# Patient Record
Sex: Female | Born: 1935 | Race: White | Hispanic: No | Smoking: Never smoker
Health system: Southern US, Community
[De-identification: ages and names within clinical notes are randomized; demographics above are authoritative.]

## PROBLEM LIST (undated history)

## (undated) DIAGNOSIS — N390 Urinary tract infection, site not specified: Secondary | ICD-10-CM

## (undated) DIAGNOSIS — K812 Acute cholecystitis with chronic cholecystitis: Secondary | ICD-10-CM

## (undated) DIAGNOSIS — I10 Essential (primary) hypertension: Secondary | ICD-10-CM

## (undated) DIAGNOSIS — G459 Transient cerebral ischemic attack, unspecified: Secondary | ICD-10-CM

## (undated) DIAGNOSIS — F0390 Unspecified dementia without behavioral disturbance: Secondary | ICD-10-CM

## (undated) HISTORY — PX: BLADDER SURGERY: SHX569

---

## 2004-05-06 ENCOUNTER — Ambulatory Visit: Payer: Self-pay | Admitting: Internal Medicine

## 2004-05-24 ENCOUNTER — Ambulatory Visit: Payer: Self-pay | Admitting: Internal Medicine

## 2004-12-13 ENCOUNTER — Ambulatory Visit: Payer: Self-pay | Admitting: Internal Medicine

## 2005-05-19 ENCOUNTER — Ambulatory Visit: Payer: Self-pay | Admitting: Internal Medicine

## 2006-06-22 ENCOUNTER — Ambulatory Visit: Payer: Self-pay | Admitting: Internal Medicine

## 2007-08-09 ENCOUNTER — Ambulatory Visit: Payer: Self-pay | Admitting: Internal Medicine

## 2008-08-15 ENCOUNTER — Ambulatory Visit: Payer: Self-pay | Admitting: Internal Medicine

## 2009-08-17 ENCOUNTER — Ambulatory Visit: Payer: Self-pay | Admitting: Internal Medicine

## 2010-08-20 ENCOUNTER — Ambulatory Visit: Payer: Self-pay | Admitting: Internal Medicine

## 2010-12-27 ENCOUNTER — Ambulatory Visit: Payer: Self-pay | Admitting: Ophthalmology

## 2011-01-05 ENCOUNTER — Ambulatory Visit: Payer: Self-pay | Admitting: Ophthalmology

## 2011-08-31 ENCOUNTER — Ambulatory Visit: Payer: Self-pay | Admitting: Ophthalmology

## 2011-09-20 ENCOUNTER — Ambulatory Visit: Payer: Self-pay | Admitting: Internal Medicine

## 2012-09-20 ENCOUNTER — Ambulatory Visit: Payer: Self-pay | Admitting: Internal Medicine

## 2013-04-17 ENCOUNTER — Ambulatory Visit: Payer: Self-pay | Admitting: Obstetrics and Gynecology

## 2013-04-17 LAB — BASIC METABOLIC PANEL
Anion Gap: 4 — ABNORMAL LOW (ref 7–16)
BUN: 20 mg/dL — AB (ref 7–18)
CO2: 31 mmol/L (ref 21–32)
Calcium, Total: 8.9 mg/dL (ref 8.5–10.1)
Chloride: 105 mmol/L (ref 98–107)
Creatinine: 1.11 mg/dL (ref 0.60–1.30)
EGFR (Non-African Amer.): 46 — ABNORMAL LOW
GFR CALC AF AMER: 53 — AB
GLUCOSE: 96 mg/dL (ref 65–99)
OSMOLALITY: 282 (ref 275–301)
POTASSIUM: 4 mmol/L (ref 3.5–5.1)
SODIUM: 140 mmol/L (ref 136–145)

## 2013-04-17 LAB — CBC
HCT: 39.1 % (ref 35.0–47.0)
HGB: 13.1 g/dL (ref 12.0–16.0)
MCH: 32.1 pg (ref 26.0–34.0)
MCHC: 33.5 g/dL (ref 32.0–36.0)
MCV: 96 fL (ref 80–100)
Platelet: 199 10*3/uL (ref 150–440)
RBC: 4.08 10*6/uL (ref 3.80–5.20)
RDW: 13.7 % (ref 11.5–14.5)
WBC: 6.9 10*3/uL (ref 3.6–11.0)

## 2013-04-23 ENCOUNTER — Ambulatory Visit: Payer: Self-pay | Admitting: Obstetrics and Gynecology

## 2013-04-24 LAB — BASIC METABOLIC PANEL
ANION GAP: 3 — AB (ref 7–16)
BUN: 17 mg/dL (ref 7–18)
CALCIUM: 8.1 mg/dL — AB (ref 8.5–10.1)
Chloride: 106 mmol/L (ref 98–107)
Co2: 30 mmol/L (ref 21–32)
Creatinine: 1.17 mg/dL (ref 0.60–1.30)
EGFR (Non-African Amer.): 43 — ABNORMAL LOW
GFR CALC AF AMER: 50 — AB
GLUCOSE: 136 mg/dL — AB (ref 65–99)
Osmolality: 281 (ref 275–301)
Potassium: 4.3 mmol/L (ref 3.5–5.1)
Sodium: 139 mmol/L (ref 136–145)

## 2013-04-24 LAB — HEMOGLOBIN: HGB: 11 g/dL — ABNORMAL LOW (ref 12.0–16.0)

## 2013-10-09 ENCOUNTER — Ambulatory Visit: Payer: Self-pay | Admitting: Internal Medicine

## 2014-01-22 ENCOUNTER — Ambulatory Visit: Payer: Self-pay | Admitting: Internal Medicine

## 2014-05-17 NOTE — Op Note (Signed)
PATIENT NAME:  Beth Ramirez, Beth Ramirez MR#:  [id number] DATE OF BIRTH:  [birth year]  DATE OF OPERATION:  04/23/2013  PRINCIPAL DIAGNOSIS: Stress urinary incontinence.   POSTOPERATIVE DIAGNOSIS: Stress urinary incontinence.   PROCEDURE: Pubovaginal sling.   SURGEON: Dr. Edrick Oh   ASSISTANT:  Dr. Hassell Done DeFrancesco  ANESTHESIA: Spinal.   INDICATION: The patient is an 81 year old white female with a history of prolapse. She has not had any recent stress component incontinence. It was felt that the degree of prolapse was a factor in the resolution of previous urinary incontinence. The decision was made to evaluate under anesthesia for possible pubovaginal sling.   DESCRIPTION OF PROCEDURE: After informed consent was obtained, the patient was taken to the Operating Room and placed in the dorsal lithotomy position under spinal anesthesia with adequate levels. The patient was then prepped and draped in the usual standard fashion. A 16 French Foley catheter was placed to gravity drainage without difficulty. The anterior vaginal wall dissection was undertaken by Dr. Enzo Bi, with my assistance. This will be dictated under a separate operative note. Once the dissection was completed, the bladder had been evaluated prior to catheter placement, as the cystocele was reduced, the patient had coughed while under spinal anesthesia, with a strong stream of urine noted coming from the ureteral orifice. This is consistent with stress component urinary incontinence. With the cystocele reduced, the decision was made, based on this information, to proceed with pubovaginal sling. The outer edges of the symphysis pubis were marked utilizing a marking pen. Small skin incisions were made utilizing the knife. A Stamey needle was utilized to advance posterior to the symphysis to the level of the incision. This was performed under tactile guidance. The standard sling kit was not available for the procedure. With the needle in  position, a segment of tape from a different kit was utilized. It was secured to the Stamey needle utilizing a 3-0 nylon suture. The sling material was then brought through the incision site on the left. The needle was then placed on the right in a similar fashion to the vaginal incision site. The other arm of the sling material was secured to the needle also utilizing a 3-0 nylon suture. The sling material was brought through the abdominal incision site. Reorientation was undertaken to correct a twist in the sling material. With it in proper orientation and position, it was placed over the mid urethra. It was secured at the 6 o'clock and 12 o'clock positions utilizing a 4-0 Vicryl suture. The remainder of the procedure was then completed. Cystoscopy was performed utilizing the 22 French rigid cystoscope. This demonstrated no significant urethral tethering. No significant bladder abnormalities were appreciated. The ureteral orifices were not initially identified. There was a moderate heaping of tissue in the central aspect of the bladder due to the repair. Indigo carmine was given. This was not visualized after approximately 10 minutes. Several attempts were made at utilizing a guidewire to identify the ureteral orifices. With continued looking, the orifices were eventually found more lateral than had been expected. The left ureteral orifice was easily intubated with the guidewire. It was easily advanced into the upper pole collecting system without difficulty. Efflux of urine was noted from the ureteral orifice.   The right ureteral orifice was then identified. Multiple attempts were made at passing the guidewire, which was unsuccessful. However, good efflux was noted from the ureteral orifice on several occasions. The bladder demonstrated no evidence of sling material passage or other injury. The bladder  was filled with approximately 500 mL of sterile saline. Some leakage was noted upon withdrawal of the scope.  Gentle traction was placed on the sling material, with resolution of the leakage. The scope was easily advanced through the urethra. Gentle traction on the scope was undertaken. Some leakage was encountered when the scope was removed. A very gentle traction on the sling material resulted in complete resolution of leakage once again. The arms of the sling were then cut at the skin level. The remainder of the vaginal closure was undertaken by Dr. Enzo Bi, with my assistance. The 1 French Foley catheter was replaced to gravity drainage. At the end of the procedure, a vaginal packing with Premarin was placed. The patient tolerated the procedure well. There were no problems or complications. Estimated blood loss from the pubovaginal sling was minimal.    ____________________________ Denice Bors. Jacqlyn Larsen, MD bsc:mr D: 04/23/2013 15:35:56 ET T: 04/23/2013 20:01:28 ET JOB#: [id number]  cc: Denice Bors. Jacqlyn Larsen, MD, <Dictator> Denice Bors. Jacqlyn Larsen, MD  Denice Bors Eljay Lave MD ELECTRONICALLY SIGNED 04/26/2013 8:14

## 2014-05-17 NOTE — Op Note (Signed)
PATIENT NAME:  Beth Ramirez, Beth W MR#:  [id number] DATE OF BIRTH:  03-03-1926  DATE OF PROCEDURE:  04/23/2013  PREOPERATIVE DIAGNOSES:  1. Pelvic organ prolapse.  2. Urinary incontinence.   POSTOPERATIVE DIAGNOSES:  1. Third-degree cystourethrocele.  2. Rectocele.  3. Enterocele.  4. Urinary incontinence.   OPERATIVE PROCEDURES:  1. Anterior/posterior colporrhaphies with enterocele ligation.  2. Pubovaginal sling with cystoscopy.   SURGEONS: Prentice DockerMartin A. DeFrancesco, MD, and Madolyn FriezeBrian S. Achilles Dunkope, MD.  FIRST ASSISTANTS: Dr. Cope/Dr. DeFrancesco.   ANESTHESIA: Spinal.   INDICATIONS: The patient is an 81 year old married white female status post hysterectomy, with a long history of pelvic organ prolapse that has been suboptimally managed with pessary. Due to recurrent vaginal ulcerations, the patient and family opted to proceed with definitive surgical repair.   FINDINGS AT SURGERY: Revealed a completely prolapsed vagina with third-degree cystourethrocele present. There also was an enterocele and high rectocele. On cystoscopy, ureteral orifices demonstrated urine efflux.   DESCRIPTION OF THE PROCEDURE: The patient was brought to the operating room, where she was placed in the sitting position. Spinal anesthetic was introduced without difficulty. She was placed in the dorsal lithotomy position using the candy-cane stirrups. A Betadine perineal and intravaginal prep and drape was performed in standard fashion. A Foley catheter was placed into the bladder, and it was draining clear yellow urine. A weighted speculum was placed into the vagina, and Allis clamps were used to grasp the apex of the vaginal vault. A transverse incision was made in the vaginal mucosa. This was undermined in the midline using Metzenbaum scissors. The vaginal mucosa was incised in the midline, and Allis-Adair retractors were used to facilitate exposure. This process was carried out up to within 1 cm of the urethral meatus.  Following dissection of vaginal mucosa off the perivesical fascia, the fascia was dissected from the vagina through both sharp and blunt dissection. The enterocele was isolated posteriorly as was the high rectocele. The high rectocele and enterocele were closed using horizontal mattress sutures of 0 Vicryl. This reduced the defect nicely. Next, the pubovaginal sling was placed. Please see Dr. Wynn Maudlinope's note for the pubovaginal sling and cystoscopy details. Following placement of the sling, the anterior colporrhaphy was performed using 2-0 Vicryl suture in a horizontal mattress technique. This reduced the bladder and gave it more natural support. The residual vaginal mucosa was trimmed, and once the pubovaginal sling was completed with cystoscopy, the vaginal mucosa was closed using simple interrupted sutures of 2-0 chromic. Vaginal packing was placed using Premarin cream as coating. The patient was then mobilized and taken to the recovery room in satisfactory condition.   ESTIMATED BLOOD LOSS: 50 mL.  IV FLUIDS: 800 mL.   URINE OUTPUT: Could not be quantified due to cystoscopy and bladder filling.   COUNTS: All instruments, needle and sponge counts were verified as correct.    ____________________________ Prentice DockerMartin A. DeFrancesco, MD mad:lb D: 04/24/2013 08:13:57 ET T: 04/24/2013 08:20:41 ET JOB#: [id number]  cc: Daphine DeutscherMartin A. DeFrancesco, MD, <Dictator> [facility] Madolyn FriezeBrian S. Achilles Dunkope, MD Prentice DockerMARTIN A DEFRANCESCO MD ELECTRONICALLY SIGNED 05/03/2013 4:42

## 2014-12-17 ENCOUNTER — Encounter: Payer: Self-pay | Admitting: Emergency Medicine

## 2014-12-17 ENCOUNTER — Emergency Department
Admission: EM | Admit: 2014-12-17 | Discharge: 2014-12-17 | Disposition: A | Discharge status: Home / Self Care | Payer: Medicare Other | Attending: Emergency Medicine | Admitting: Emergency Medicine

## 2014-12-17 DIAGNOSIS — Z7982 Long term (current) use of aspirin: Secondary | ICD-10-CM | POA: Diagnosis not present

## 2014-12-17 DIAGNOSIS — I10 Essential (primary) hypertension: Secondary | ICD-10-CM | POA: Diagnosis not present

## 2014-12-17 DIAGNOSIS — Z79899 Other long term (current) drug therapy: Secondary | ICD-10-CM | POA: Insufficient documentation

## 2014-12-17 DIAGNOSIS — N390 Urinary tract infection, site not specified: Secondary | ICD-10-CM

## 2014-12-17 DIAGNOSIS — R300 Dysuria: Secondary | ICD-10-CM | POA: Diagnosis present

## 2014-12-17 DIAGNOSIS — Z792 Long term (current) use of antibiotics: Secondary | ICD-10-CM | POA: Diagnosis not present

## 2014-12-17 HISTORY — DX: Urinary tract infection, site not specified: N39.0

## 2014-12-17 HISTORY — DX: Unspecified dementia, unspecified severity, without behavioral disturbance, psychotic disturbance, mood disturbance, and anxiety: F03.90

## 2014-12-17 HISTORY — DX: Essential (primary) hypertension: I10

## 2014-12-17 MED ORDER — CEFTRIAXONE SODIUM 1 G IJ SOLR
1.0000 g | Freq: Once | INTRAMUSCULAR | Status: AC
  Administered 2014-12-17: 1 g via INTRAMUSCULAR
  Filled 2014-12-17: qty 10

## 2014-12-17 MED ORDER — LIDOCAINE HCL (PF) 1 % IJ SOLN
INTRAMUSCULAR | Status: AC
  Administered 2014-12-17: 5 mL
  Filled 2014-12-17: qty 5

## 2014-12-17 NOTE — Discharge Instructions (Signed)
You were given one shot of Rocephin here in the emergency department which will cover you for 24 hours. A prescription for Vantin (cefpodoxime) was called into [facility] on [address]  Return to the emergency department for any worsening condition including abdominal pain, inability to urinate, fever, or altered mental status.   Urinary Tract Infection A urinary tract infection (UTI) can occur any place along the urinary tract. The tract includes the kidneys, ureters, bladder, and urethra. A type of germ called bacteria often causes a UTI. UTIs are often helped with antibiotic medicine.  HOME CARE   If given, take antibiotics as told by your doctor. Finish them even if you start to feel better.  Drink enough fluids to keep your pee (urine) clear or pale yellow.  Avoid tea, drinks with caffeine, and bubbly (carbonated) drinks.  Pee often. Avoid holding your pee in for a long time.  Pee before and after having sex (intercourse).  Wipe from front to back after you poop (bowel movement) if you are a woman. Use each tissue only once. GET HELP RIGHT AWAY IF:   You have back pain.  You have lower belly (abdominal) pain.  You have chills.  You feel sick to your stomach (nauseous).  You throw up (vomit).  Your burning or discomfort with peeing does not go away.  You have a fever.  Your symptoms are not better in 3 days. MAKE SURE YOU:   Understand these instructions.  Will watch your condition.  Will get help right away if you are not doing well or get worse.   This information is not intended to replace advice given to you by your health care provider. Make sure you discuss any questions you have with your health care provider.   Document Released: 06/29/2007 Document Revised: 01/31/2014 Document Reviewed: 08/11/2011 Elsevier Interactive Patient Education [employer]2016 Elsevier Inc.

## 2014-12-17 NOTE — ED Provider Notes (Signed)
[facility] Emergency Department Provider Note   ____________________________________________  Time seen:  I have reviewed the triage vital signs and the triage nursing note.  HISTORY  Chief Complaint Urinary Tract Infection   Historian Patient limited historian due to dementia Daughter provides history  HPI Beth Ramirez is a 81 y.o. female who is here "for IV antibiotics" because she had a urine culture results that returned to Dr.Hande which showed multiple resistance patterns, and given patient's allergy history, there was concern that there was no by mouth antibiotic choice.  Patient has had urinary frequency and burning for several days which prompted an outpatient urine culture which did grow urinary tract infection. She takes Bactrim 50 mg daily as a preventative. She's actually had decreased amount of urinary tract infections since bladder tack surgery 1 year ago.  No bowel pain. No fevers.    Past Medical History  Diagnosis Date  . Hypertension   . Dementia   . Urinary tract infection     There are no active problems to display for this patient.   Past Surgical History  Procedure Laterality Date  . Bladder surgery      bladder tact not sling    Current Outpatient Rx  Name  Route  Sig  Dispense  Refill  . aspirin EC 81 MG tablet   Oral   Take 81 mg by mouth at bedtime.         . hydrALAZINE (APRESOLINE) 50 MG tablet   Oral   Take 50 mg by mouth 3 (three) times daily.         . Melatonin 10 MG TABS   Oral   Take 10 mg by mouth at bedtime as needed (for sleep).         . metoprolol (LOPRESSOR) 100 MG tablet   Oral   Take 100 mg by mouth 2 (two) times daily.         . Multiple Vitamin (MULTIVITAMIN WITH MINERALS) TABS tablet   Oral   Take 1 tablet by mouth daily.         Marland Kitchen sulfamethoxazole-trimethoprim (BACTRIM,SEPTRA) 400-80 MG tablet   Oral   Take 1 tablet by mouth daily.            Allergies Ciprofloxacin; Doxycycline; Zofran; and Lisinopril  History reviewed. No pertinent family history.  Social History Social History  Substance Use Topics  . Smoking status: Never Smoker   . Smokeless tobacco: None  . Alcohol Use: No    Review of Systems  Constitutional: Negative for fever. Eyes: Negative for visual changes. ENT: Negative for sore throat. Cardiovascular: Negative for chest pain. Respiratory: Negative for shortness of breath. Gastrointestinal: Negative for abdominal pain, vomiting and diarrhea. Genitourinary: Positive for dysuria. Musculoskeletal: Negative for back pain. Skin: Negative for rash. Neurological: Negative for headache. 10 point Review of Systems otherwise negative ____________________________________________   PHYSICAL EXAM:  VITAL SIGNS: ED Triage Vitals  Enc Vitals Group     BP 12/17/14 1645 192/87 mmHg     Pulse Rate 12/17/14 1645 69     Resp 12/17/14 1645 18     Temp 12/17/14 1645 98 F (36.7 C)     Temp Source 12/17/14 1645 Oral     SpO2 --      Weight 12/17/14 1645 188 lb (85.276 kg)     Height 12/17/14 1645  (1.702 m)     Head Cir --      Peak Flow --  Pain Score --      Pain Loc --      Pain Edu? --      Excl. in GC? --      Constitutional: Alert and oriented. Well appearing and in no distress. Eyes: Conjunctivae are normal. PERRL. Normal extraocular movements. ENT   Head: Normocephalic and atraumatic.   Nose: No congestion/rhinnorhea.   Mouth/Throat: Mucous membranes are moist.   Neck: No stridor. Cardiovascular/Chest: Normal rate, regular rhythm.  No murmurs, rubs, or gallops. Respiratory: Normal respiratory effort without tachypnea nor retractions. Breath sounds are clear and equal bilaterally. No wheezes/rales/rhonchi. Gastrointestinal: Soft. No distention, no guarding, no rebound. Nontender   Genitourinary/rectal:Deferred Musculoskeletal:   No edema. Neurologic: No gross or  focal neurologic deficits are appreciated. Skin:  Skin is warm, dry and intact. No rash noted.   ____________________________________________   EKG I, Governor Rooksebecca Jaydalyn Demattia, MD, the attending physician have personally viewed and interpreted all ECGs.  Note EKG performed ____________________________________________  LABS (pertinent positives/negatives)  None  ____________________________________________  RADIOLOGY All Xrays were viewed by me. Imaging interpreted by Radiologist.  None __________________________________________  PROCEDURES  Procedure(s) performed: None  Critical Care performed: None  ____________________________________________   ED COURSE / ASSESSMENT AND PLAN  CONSULTATIONS: None  Pertinent labs & imaging results that were available during my care of the patient were reviewed by me and considered in my medical decision making (see chart for details).   I reviewed the patient's urine culture which was sent with him from clinic which included multiple resistances. Patient has an allergy to Cipro which is hives, and although the panel is sensitive to Levaquin, I will avoid this. The two third-generation cephalosporins showed sensitive including Rocephin and patient was given a dose of Rocephin here in the emergency room.  First-generation and second generation cephalosporins showed resistance pattern.  I am going to prescribe by mouth Vantin/Cefpodoxime, third-generation cephalosporin, for outpatient treatment for one week.  Patient / Family / Caregiver informed of clinical course, medical decision-making process, and agree with plan.   I discussed return precautions, follow-up instructions, and discharged instructions with patient and/or family.  ___________________________________________   FINAL CLINICAL IMPRESSION(S) / ED DIAGNOSES   Final diagnoses:  Urinary tract infection without hematuria, site unspecified       Governor Rooksebecca Miryah Ralls, MD 12/17/14  1902

## 2014-12-17 NOTE — ED Notes (Signed)
Pt's daughter said she had allergic reaction in 2010 to doxycycline; hx of issue with cipr. Pt has long hx of uti, and takes 50mg  bactrim daily.

## 2014-12-17 NOTE — ED Notes (Signed)
[facility] after [facility] received the urine culture results and instructed the pt to come to the ED for IV abx TX.Marland Kitchen. Urine culture results with pt.

## 2016-07-21 ENCOUNTER — Emergency Department
Admission: EM | Admit: 2016-07-21 | Discharge: 2016-07-21 | Disposition: A | Discharge status: Home / Self Care | Payer: Medicare Other | Attending: Emergency Medicine | Admitting: Emergency Medicine

## 2016-07-21 ENCOUNTER — Emergency Department: Payer: Medicare Other

## 2016-07-21 ENCOUNTER — Encounter: Payer: Self-pay | Admitting: Emergency Medicine

## 2016-07-21 DIAGNOSIS — Z79899 Other long term (current) drug therapy: Secondary | ICD-10-CM | POA: Diagnosis not present

## 2016-07-21 DIAGNOSIS — I10 Essential (primary) hypertension: Secondary | ICD-10-CM | POA: Insufficient documentation

## 2016-07-21 DIAGNOSIS — R569 Unspecified convulsions: Secondary | ICD-10-CM | POA: Diagnosis present

## 2016-07-21 DIAGNOSIS — R109 Unspecified abdominal pain: Secondary | ICD-10-CM | POA: Insufficient documentation

## 2016-07-21 DIAGNOSIS — Z7982 Long term (current) use of aspirin: Secondary | ICD-10-CM | POA: Insufficient documentation

## 2016-07-21 DIAGNOSIS — F0390 Unspecified dementia without behavioral disturbance: Secondary | ICD-10-CM | POA: Diagnosis not present

## 2016-07-21 LAB — LIPASE, BLOOD: LIPASE: 27 U/L (ref 11–51)

## 2016-07-21 LAB — CBC WITH DIFFERENTIAL/PLATELET
Basophils Absolute: 0.1 10*3/uL (ref 0–0.1)
Basophils Relative: 1 %
EOS PCT: 1 %
Eosinophils Absolute: 0.1 10*3/uL (ref 0–0.7)
HEMATOCRIT: 38 % (ref 35.0–47.0)
Hemoglobin: 13 g/dL (ref 12.0–16.0)
LYMPHS ABS: 0.8 10*3/uL — AB (ref 1.0–3.6)
LYMPHS PCT: 9 %
MCH: 30.5 pg (ref 26.0–34.0)
MCHC: 34.2 g/dL (ref 32.0–36.0)
MCV: 89.1 fL (ref 80.0–100.0)
MONO ABS: 0.5 10*3/uL (ref 0.2–0.9)
Monocytes Relative: 5 %
NEUTROS ABS: 7.9 10*3/uL — AB (ref 1.4–6.5)
Neutrophils Relative %: 84 %
Platelets: 237 10*3/uL (ref 150–440)
RBC: 4.27 MIL/uL (ref 3.80–5.20)
RDW: 15 % — AB (ref 11.5–14.5)
WBC: 9.3 10*3/uL (ref 3.6–11.0)

## 2016-07-21 LAB — COMPREHENSIVE METABOLIC PANEL
ALT: 16 U/L (ref 14–54)
AST: 27 U/L (ref 15–41)
Albumin: 3.7 g/dL (ref 3.5–5.0)
Alkaline Phosphatase: 83 U/L (ref 38–126)
Anion gap: 10 (ref 5–15)
BILIRUBIN TOTAL: 0.8 mg/dL (ref 0.3–1.2)
BUN: 14 mg/dL (ref 6–20)
CHLORIDE: 104 mmol/L (ref 101–111)
CO2: 25 mmol/L (ref 22–32)
CREATININE: 0.88 mg/dL (ref 0.44–1.00)
Calcium: 9.1 mg/dL (ref 8.9–10.3)
GFR, EST NON AFRICAN AMERICAN: 57 mL/min — AB (ref >60)
Glucose, Bld: 134 mg/dL — ABNORMAL HIGH (ref 65–99)
Potassium: 3.6 mmol/L (ref 3.5–5.1)
Sodium: 139 mmol/L (ref 135–145)
TOTAL PROTEIN: 7.1 g/dL (ref 6.5–8.1)

## 2016-07-21 LAB — URINALYSIS, COMPLETE (UACMP) WITH MICROSCOPIC
Bacteria, UA: NONE SEEN
Bilirubin Urine: NEGATIVE
GLUCOSE, UA: NEGATIVE mg/dL
Hgb urine dipstick: NEGATIVE
Ketones, ur: NEGATIVE mg/dL
Leukocytes, UA: NEGATIVE
Nitrite: NEGATIVE
Protein, ur: 30 mg/dL — AB
SPECIFIC GRAVITY, URINE: 1.013 (ref 1.005–1.030)
SQUAMOUS EPITHELIAL / LPF: NONE SEEN
pH: 6 (ref 5.0–8.0)

## 2016-07-21 LAB — TROPONIN I: Troponin I: <0.03 ng/mL (ref <0.03)

## 2016-07-21 MED ORDER — IOPAMIDOL (ISOVUE-300) INJECTION 61%
100.0000 mL | Freq: Once | INTRAVENOUS | Status: AC | PRN
  Administered 2016-07-21: 100 mL via INTRAVENOUS

## 2016-07-21 NOTE — Discharge Instructions (Signed)
Please seek medical attention for any high fevers, chest pain, shortness of breath, change in behavior, persistent vomiting, bloody stool or any other new or concerning symptoms.  

## 2016-07-21 NOTE — ED Triage Notes (Signed)
Pt ems from [facility] for seizure activity x2 that started at 0630. Pt c/o pain. States that her stomach and then her chest hurts.

## 2016-07-21 NOTE — ED Notes (Signed)
Pt discharged to home.  Family member driving.  Discharge instructions reviewed with family and Rosann Auerbachrish, [facility] at facility.  Verbalized understanding.  No questions or concerns at this time.  Teach back verified.  Pt in NAD.  No items left in ED.

## 2016-07-21 NOTE — ED Provider Notes (Signed)
[facility] Emergency Department Provider Note   ____________________________________________   I have reviewed the triage vital signs and the nursing notes.   HISTORY  Chief Complaint Seizures   History limited by: Dementia   HPI Beth Ramirez is a 83 y.o. female who presents to the emergency department today via EMS because of concerns for seizure-like activity noted at nursing facility. EMS reports that the staff apparently noticed one seizure earlier in the morning. She then had another seizure later on which prompted the EMS call. Apparently the patient was sitting down for at least one of the seizures. Patient herself has history of dementia cannot give any relevant history about the seizure.   Past Medical History:  Diagnosis Date  . Dementia   . Hypertension   . Urinary tract infection     There are no active problems to display for this patient.   Past Surgical History:  Procedure Laterality Date  . BLADDER SURGERY     bladder tact not sling    Prior to Admission medications   Medication Sig Start Date End Date Taking? Authorizing Provider  aspirin EC 81 MG tablet Take 81 mg by mouth at bedtime.    [provider]  hydrALAZINE (APRESOLINE) 50 MG tablet Take 50 mg by mouth 3 (three) times daily.    [provider]  Melatonin 10 MG TABS Take 10 mg by mouth at bedtime as needed (for sleep).    [provider]  metoprolol (LOPRESSOR) 100 MG tablet Take 100 mg by mouth 2 (two) times daily.    [provider]  Multiple Vitamin (MULTIVITAMIN WITH MINERALS) TABS tablet Take 1 tablet by mouth daily.    [provider]  sulfamethoxazole-trimethoprim (BACTRIM,SEPTRA) 400-80 MG tablet Take 1 tablet by mouth daily.    [provider]    Allergies Ace inhibitors; Ciprofloxacin; Doxycycline; Zofran [ondansetron hcl]; and Lisinopril  History reviewed. No pertinent family history.  Social  History Social History  Substance Use Topics  . Smoking status: Never Smoker  . Smokeless tobacco: Not on file  . Alcohol use No    Review of Systems Unable to obtain secondary to dementia  ____________________________________________   PHYSICAL EXAM:  VITAL SIGNS: ED Triage Vitals  Enc Vitals Group     BP 07/21/16 1033 (!) 165/79     Pulse --      Resp --      Temp 07/21/16 1033 98.4 F (36.9 C)     Temp Source 07/21/16 1033 Axillary     SpO2 07/21/16 1033 94 %     Weight 07/21/16 1034 148 lb (67.1 kg)   Constitutional: Awake alert. Not oriented to time, or event. Eyes: Conjunctivae are normal.  ENT   Head: Normocephalic and atraumatic.   Nose: No congestion/rhinnorhea.   Mouth/Throat: Mucous membranes are moist.   Neck: No stridor. Hematological/Lymphatic/Immunilogical: No cervical lymphadenopathy. Cardiovascular: Normal rate, regular rhythm.  No murmurs, rubs, or gallops.  Respiratory: Normal respiratory effort without tachypnea nor retractions. Breath sounds are clear and equal bilaterally. No wheezes/rales/rhonchi. Gastrointestinal: Soft and non tender. No rebound. No guarding.  Genitourinary: Deferred Musculoskeletal: Normal range of motion in all extremities. No lower extremity edema. Neurologic:  Awake and alert. Not oriented. Moves all extremities.  Skin:  Skin is warm, dry and intact. No rash noted.   ____________________________________________    LABS (pertinent positives/negatives)  Labs Reviewed  CBC WITH DIFFERENTIAL/PLATELET - Abnormal; Notable for the following:  Result Value   RDW 15.0 (*)    Neutro Abs 7.9 (*)    Lymphs Abs 0.8 (*)    All other components within normal limits  COMPREHENSIVE METABOLIC PANEL - Abnormal; Notable for the following:    Glucose, Bld 134 (*)    GFR calc non Af Amer 57 (*)    All other components within normal limits  URINALYSIS, COMPLETE (UACMP) WITH MICROSCOPIC - Abnormal; Notable for the  following:    Color, Urine YELLOW (*)    APPearance HAZY (*)    Protein, ur 30 (*)    All other components within normal limits  LIPASE, BLOOD  TROPONIN I     ____________________________________________   EKG  I, Phineas Semen, attending physician, personally viewed and interpreted this EKG  EKG Time: 1048 Rate: 101 Rhythm: atrial tachycardia Axis: left axis deviation Intervals: qtc 456 QRS: narrow, q waves V1, V2, V3 ST changes: no st elevation Impression: abnormal ekg   ____________________________________________    RADIOLOGY  CT abd/pel IMPRESSION: 1. No explanation for the patient's abdominal pain is seen. No renal or ureteral calculi are noted. 2. Small hiatal hernia. 3. Multiple colonic diverticula. No present evidence of diverticulitis. 4. Moderate abdominal aortic atherosclerosis  CT head IMPRESSION: Overall stable age related cerebral atrophy, ventriculomegaly and periventricular white matter disease.  No acute intracranial findings or mass lesions.   ____________________________________________   PROCEDURES  Procedures  ____________________________________________   INITIAL IMPRESSION / ASSESSMENT AND PLAN / ED COURSE  Pertinent labs & imaging results that were available during my care of the patient were reviewed by me and considered in my medical decision making (see chart for details).  Patient presented to the emergency department today from living facility because of concerns for 2 seizure-like episodes. Patient without a seizure-like episodes here. Patient does have a baseline of dementia. CT head was negative. There is also some concern that the patient was complaining of abdominal pain since CT abdomen and pelvis was performed without any concerning findings. I had a discussion with family. At this point given negative workup they do feel comfortable letting the patient be discharged back to living facility. I think this is very  reasonable.  ____________________________________________   FINAL CLINICAL IMPRESSION(S) / ED DIAGNOSES  Final diagnoses:  Seizure-like activity (HCC)     Note: This dictation was prepared with Dragon dictation. Any transcriptional errors that result from this process are unintentional     Phineas Semen, MD 07/21/16 1359

## 2016-07-21 NOTE — ED Notes (Signed)
This tech placed pt on bedside alarm; 2 family members at bedside; pt have fall risk bracelet and socks on for safety; pt given 2 warm blankets; pt stating in pain as she has been since ems brought pt rn and dr both aware

## 2017-03-29 ENCOUNTER — Emergency Department: Payer: Medicare Other

## 2017-03-29 ENCOUNTER — Encounter: Payer: Self-pay | Admitting: Emergency Medicine

## 2017-03-29 ENCOUNTER — Emergency Department
Admission: EM | Admit: 2017-03-29 | Discharge: 2017-03-29 | Disposition: A | Discharge status: Home / Self Care | Payer: Medicare Other | Attending: Emergency Medicine | Admitting: Emergency Medicine

## 2017-03-29 ENCOUNTER — Other Ambulatory Visit: Payer: Self-pay

## 2017-03-29 DIAGNOSIS — R569 Unspecified convulsions: Secondary | ICD-10-CM

## 2017-03-29 DIAGNOSIS — I10 Essential (primary) hypertension: Secondary | ICD-10-CM | POA: Diagnosis not present

## 2017-03-29 DIAGNOSIS — Z79899 Other long term (current) drug therapy: Secondary | ICD-10-CM | POA: Insufficient documentation

## 2017-03-29 DIAGNOSIS — F0390 Unspecified dementia without behavioral disturbance: Secondary | ICD-10-CM | POA: Insufficient documentation

## 2017-03-29 DIAGNOSIS — Z7982 Long term (current) use of aspirin: Secondary | ICD-10-CM | POA: Diagnosis not present

## 2017-03-29 LAB — COMPREHENSIVE METABOLIC PANEL
ALK PHOS: 67 U/L (ref 38–126)
ALT: 13 U/L — AB (ref 14–54)
AST: 37 U/L (ref 15–41)
Albumin: 3.5 g/dL (ref 3.5–5.0)
Anion gap: 13 (ref 5–15)
BILIRUBIN TOTAL: 0.9 mg/dL (ref 0.3–1.2)
BUN: 16 mg/dL (ref 6–20)
CALCIUM: 8.6 mg/dL — AB (ref 8.9–10.3)
CO2: 20 mmol/L — ABNORMAL LOW (ref 22–32)
Chloride: 104 mmol/L (ref 101–111)
Creatinine, Ser: 0.77 mg/dL (ref 0.44–1.00)
GFR calc Af Amer: >60 mL/min (ref >60)
Glucose, Bld: 116 mg/dL — ABNORMAL HIGH (ref 65–99)
Potassium: 3.5 mmol/L (ref 3.5–5.1)
Sodium: 137 mmol/L (ref 135–145)
TOTAL PROTEIN: 6.5 g/dL (ref 6.5–8.1)

## 2017-03-29 LAB — CBC
HEMATOCRIT: 36.2 % (ref 35.0–47.0)
Hemoglobin: 12.2 g/dL (ref 12.0–16.0)
MCH: 31.4 pg (ref 26.0–34.0)
MCHC: 33.8 g/dL (ref 32.0–36.0)
MCV: 92.6 fL (ref 80.0–100.0)
Platelets: 209 10*3/uL (ref 150–440)
RBC: 3.9 MIL/uL (ref 3.80–5.20)
RDW: 13.5 % (ref 11.5–14.5)
WBC: 5.3 10*3/uL (ref 3.6–11.0)

## 2017-03-29 LAB — URINALYSIS, COMPLETE (UACMP) WITH MICROSCOPIC
Bilirubin Urine: NEGATIVE
GLUCOSE, UA: NEGATIVE mg/dL
Hgb urine dipstick: NEGATIVE
Ketones, ur: NEGATIVE mg/dL
Leukocytes, UA: NEGATIVE
Nitrite: NEGATIVE
PH: 7 (ref 5.0–8.0)
PROTEIN: NEGATIVE mg/dL
Specific Gravity, Urine: 1.011 (ref 1.005–1.030)
Squamous Epithelial / LPF: NONE SEEN

## 2017-03-29 LAB — TROPONIN I

## 2017-03-29 NOTE — ED Notes (Signed)
Patient's discharge and follow up information reviewed with patient by ED nursing staff and patient given the opportunity to ask questions pertaining to ED visit and discharge plan of care. Patient advised that should symptoms not continue to improve, resolve entirely, or should new symptoms develop then a follow up visit with their PCP or a return visit to the ED may be warranted. Patient verbalized consent and understanding of discharge plan of care including potential need for further evaluation. Patient discharged in stable condition per attending ED physician on duty.   Pt returning to [facility] with daughter via POV.

## 2017-03-29 NOTE — Discharge Instructions (Signed)
Your tests today, including blood and urine lab tests,

## 2017-03-29 NOTE — ED Notes (Signed)
Patient transported to CT 

## 2017-03-29 NOTE — ED Provider Notes (Addendum)
 -----------------------------------------   11:11 AM on 03/29/2017 -----------------------------------------  EKG interpreted by me. Sinus rhythm rate of 81, normal axis and intervals. Poor R-wave progression in anterior precordial leads. Normal ST segments and T waves. No acute ischemic changes.   Sharman CheekStafford, Latacha Texeira, MD 03/29/17 1111   ----------------------------------------- 12:49 PM on 03/29/2017 -----------------------------------------  Urinalysis unremarkable. Vitals remain unremarkable, stable. He is well-appearing not in distress, calm and comfortable on my exam. Suitable for discharge home and continued follow-up with primary care. Discussed with the patient's daughter at bedside who agrees that the patient is at her chronic baseline and feels that the episode today is related to her dementia. Patient has had similar episodes in the past and daughter would not have sought care for today's symptoms if the patient had not already been sent to the ED.  Final diagnoses:  Seizure-like activity (HCC)  Dementia without behavioral disturbance, unspecified dementia type      Sharman CheekStafford, Keelyn Monjaras, MD 03/29/17 1250

## 2017-03-29 NOTE — ED Triage Notes (Signed)
Pt bib [facility] from [facility] d/t seizure like activity. Pt was post-ictal state upon EMS arrival. Pt has possible bite to tongue. Pt alert to self. Hx Dementia, HTN, recurrent UTI. VSS, CBG 120

## 2017-03-29 NOTE — ED Provider Notes (Signed)
[facility] Emergency Department Provider Note   ____________________________________________   First MD Initiated Contact with Patient 03/29/17 [phone]     (approximate)  I have reviewed the triage vital signs and the nursing notes.   HISTORY  Chief Complaint Seizures  Patient has a history of dementia and is refusing to answer questions.  HPI Beth Ramirez is a 84 y.o. female who comes into the hospital today with seizure-like activity.  The patient is demented is from a nursing home.  The patient does not have a history of seizures but does have blood in her mouth.  EMS states that the nursing home saw the patient twitching and was concerned about a seizure.  She is here today for evaluation.  Past Medical History:  Diagnosis Date  . Dementia   . Hypertension   . Urinary tract infection     There are no active problems to display for this patient.   Past Surgical History:  Procedure Laterality Date  . BLADDER SURGERY     bladder tact not sling    Prior to Admission medications   Medication Sig Start Date End Date Taking? Authorizing Provider  aspirin EC 81 MG tablet Take 81 mg by mouth at bedtime.    [provider]  docusate sodium (COLACE) 100 MG capsule Take 100 mg by mouth daily.    [provider]  dorzolamide-timolol (COSOPT) 22.3-6.8 MG/ML ophthalmic solution Place 1 drop into both eyes 2 (two) times daily.    [provider]  hydrALAZINE (APRESOLINE) 50 MG tablet Take 50 mg by mouth 3 (three) times daily.    [provider]  metoprolol (LOPRESSOR) 100 MG tablet Take 100 mg by mouth 2 (two) times daily.    [provider]  nitrofurantoin, macrocrystal-monohydrate, (MACROBID) 100 MG capsule Take 100 mg by mouth daily.    [provider]    Allergies Ace inhibitors; Ciprofloxacin; Doxycycline; Zofran [ondansetron hcl]; and Lisinopril  No family history on file.  Social  History Social History   Tobacco Use  . Smoking status: Never Smoker  Substance Use Topics  . Alcohol use: No  . Drug use: Not on file    Review of Systems  Unable to assess fully due to patient dementia  ____________________________________________   PHYSICAL EXAM:  VITAL SIGNS: ED Triage Vitals [03/29/17 0618]  Enc Vitals Group     BP (!) 145/68     Pulse Rate 85     Resp 19     Temp (!) 97.4 F (36.3 C)     Temp Source Axillary     SpO2 95 %     Weight      Height      Head Circumference      Peak Flow      Pain Score      Pain Loc      Pain Edu?      Excl. in GC?     Constitutional: Patient with eyes closed but will not answer questions about orientation in no acute distress Eyes: Conjunctivae are normal. PERRL. EOMI. Head: Atraumatic. Nose: No congestion/rhinnorhea. Mouth/Throat: Mucous membranes are moist.  Oropharynx non-erythematous. Cardiovascular: Normal rate, regular rhythm. Grossly normal heart sounds.  Good peripheral circulation. Respiratory: Normal respiratory effort.  No retractions. Lungs CTAB. Gastrointestinal: Soft and nontender. No distention.  Positive bowel sounds Musculoskeletal: No lower extremity tenderness nor edema.   Neurologic:  Normal speech and language.  Patient will occasionally yell out at staff  but will not answer questions, patient will not follow instructions to assess her neurologic status but she was moving her arms when trying to grab and hit staff. Skin:  Skin is warm, dry and intact.  Psychiatric: Mood and affect are normal.   ____________________________________________   LABS (all labs ordered are listed, but only abnormal results are displayed)  Labs Reviewed  COMPREHENSIVE METABOLIC PANEL - Abnormal; Notable for the following components:      Result Value   CO2 20 (*)    Glucose, Bld 116 (*)    Calcium 8.6 (*)    ALT 13 (*)    All other components within normal limits  CBC  TROPONIN I  URINALYSIS,  COMPLETE (UACMP) WITH MICROSCOPIC   ____________________________________________  EKG  none ____________________________________________  RADIOLOGY  ED MD interpretation:  CT head: no acute intracranial finding, advanced atrophy  Official radiology report(s): Ct Head Wo Contrast  Result Date: 03/29/2017 CLINICAL DATA:  Seizure, new, nontraumatic.  History of dementia. EXAM: CT HEAD WITHOUT CONTRAST TECHNIQUE: Contiguous axial images were obtained from the base of the skull through the vertex without intravenous contrast. COMPARISON:  07/21/2016 FINDINGS: Brain: No evidence of acute infarction, hemorrhage, hydrocephalus, extra-axial collection or mass lesion/mass effect. Atrophy that is severe in the medial temporal lobes, an Alzheimer's disease pattern in this patient with history of dementia. Confluent chronic small vessel ischemic change in the deep cerebral white matter. Vascular: Atherosclerotic calcification. Skull: No acute or aggressive finding. Sinuses/Orbits: Bilateral cataract resection. Generalized mucosal thickening in the paranasal sinuses without fluid level. IMPRESSION: 1. No acute intracranial finding. 2. Advanced atrophy in keeping with history of dementia. Electronically Signed   By: Marnee Spring M.D.   On: 03/29/2017 06:58    ____________________________________________   PROCEDURES  Procedure(s) performed: None  Procedures  Critical Care performed: No  ____________________________________________   INITIAL IMPRESSION / ASSESSMENT AND PLAN / ED COURSE  As part of my medical decision making, I reviewed the following data within the electronic MEDICAL RECORD NUMBER Notes from prior ED visits and [place] Controlled Substance Database   This is an 84 year old female who comes from a nursing home with possible seizure-like activity.  The patient has a history of dementia and is being combative with staff.  She was grabbing EMS by the jacket and trying to hit staff with  her other arm.  The patient is yelling out words but will not answer direct questions.  Differential diagnosis includes seizure, organic brain disease, urinary tract infection  We did check some blood work on the patient which was unremarkable.  The patient also had a CT scan which was negative.  At this time we are awaiting the results of the urinalysis.  The patient does have some blood in her mouth but will not open her mouth so we can see.  The patient's care was signed out to Dr. Scotty Court who will await the results of the urinalysis and reassess the patient.  We will also ensure that the patient has an EKG.      ____________________________________________   FINAL CLINICAL IMPRESSION(S) / ED DIAGNOSES  Final diagnoses:  None     ED Discharge Orders    None       Note:  This document was prepared using Dragon voice recognition software and may include unintentional dictation errors.    Rebecka Apley, MD 03/29/17 [phone]

## 2017-07-13 ENCOUNTER — Emergency Department
Admission: EM | Admit: 2017-07-13 | Discharge: 2017-07-13 | Disposition: A | Discharge status: Home / Self Care | Payer: Medicare Other | Attending: Emergency Medicine | Admitting: Emergency Medicine

## 2017-07-13 ENCOUNTER — Other Ambulatory Visit: Payer: Self-pay

## 2017-07-13 ENCOUNTER — Emergency Department: Payer: Medicare Other

## 2017-07-13 ENCOUNTER — Encounter: Payer: Self-pay | Admitting: *Deleted

## 2017-07-13 DIAGNOSIS — F0390 Unspecified dementia without behavioral disturbance: Secondary | ICD-10-CM | POA: Insufficient documentation

## 2017-07-13 DIAGNOSIS — Z7982 Long term (current) use of aspirin: Secondary | ICD-10-CM | POA: Insufficient documentation

## 2017-07-13 DIAGNOSIS — Z79899 Other long term (current) drug therapy: Secondary | ICD-10-CM | POA: Insufficient documentation

## 2017-07-13 DIAGNOSIS — I10 Essential (primary) hypertension: Secondary | ICD-10-CM | POA: Diagnosis not present

## 2017-07-13 DIAGNOSIS — R569 Unspecified convulsions: Secondary | ICD-10-CM

## 2017-07-13 HISTORY — DX: Transient cerebral ischemic attack, unspecified: G45.9

## 2017-07-13 HISTORY — DX: Acute cholecystitis with chronic cholecystitis: K81.2

## 2017-07-13 LAB — URINALYSIS, COMPLETE (UACMP) WITH MICROSCOPIC
BILIRUBIN URINE: NEGATIVE
Bacteria, UA: NONE SEEN
GLUCOSE, UA: NEGATIVE mg/dL
Hgb urine dipstick: NEGATIVE
Ketones, ur: NEGATIVE mg/dL
LEUKOCYTES UA: NEGATIVE
Nitrite: NEGATIVE
Protein, ur: 30 mg/dL — AB
SPECIFIC GRAVITY, URINE: 1.016 (ref 1.005–1.030)
pH: 6 (ref 5.0–8.0)

## 2017-07-13 LAB — COMPREHENSIVE METABOLIC PANEL
ALT: 17 U/L (ref 14–54)
AST: 31 U/L (ref 15–41)
Albumin: 3.2 g/dL — ABNORMAL LOW (ref 3.5–5.0)
Alkaline Phosphatase: 79 U/L (ref 38–126)
Anion gap: 9 (ref 5–15)
BILIRUBIN TOTAL: 0.9 mg/dL (ref 0.3–1.2)
BUN: 19 mg/dL (ref 6–20)
CHLORIDE: 105 mmol/L (ref 101–111)
CO2: 24 mmol/L (ref 22–32)
Calcium: 8.2 mg/dL — ABNORMAL LOW (ref 8.9–10.3)
Creatinine, Ser: 0.7 mg/dL (ref 0.44–1.00)
GFR calc Af Amer: >60 mL/min (ref >60)
Glucose, Bld: 125 mg/dL — ABNORMAL HIGH (ref 65–99)
POTASSIUM: 3.7 mmol/L (ref 3.5–5.1)
Sodium: 138 mmol/L (ref 135–145)
TOTAL PROTEIN: 6.3 g/dL — AB (ref 6.5–8.1)

## 2017-07-13 LAB — CBC WITH DIFFERENTIAL/PLATELET
Basophils Absolute: 0.1 10*3/uL (ref 0–0.1)
Basophils Relative: 1 %
EOS PCT: 0 %
Eosinophils Absolute: 0 10*3/uL (ref 0–0.7)
HEMATOCRIT: 34.4 % — AB (ref 35.0–47.0)
Hemoglobin: 11.9 g/dL — ABNORMAL LOW (ref 12.0–16.0)
LYMPHS PCT: 4 %
Lymphs Abs: 0.6 10*3/uL — ABNORMAL LOW (ref 1.0–3.6)
MCH: 32.8 pg (ref 26.0–34.0)
MCHC: 34.5 g/dL (ref 32.0–36.0)
MCV: 95.2 fL (ref 80.0–100.0)
MONO ABS: 0.9 10*3/uL (ref 0.2–0.9)
MONOS PCT: 6 %
NEUTROS ABS: 13.4 10*3/uL — AB (ref 1.4–6.5)
Neutrophils Relative %: 89 %
PLATELETS: 195 10*3/uL (ref 150–440)
RBC: 3.62 MIL/uL — ABNORMAL LOW (ref 3.80–5.20)
RDW: 14.4 % (ref 11.5–14.5)
WBC: 15 10*3/uL — ABNORMAL HIGH (ref 3.6–11.0)

## 2017-07-13 LAB — TROPONIN I: Troponin I: <0.03 ng/mL (ref <0.03)

## 2017-07-13 MED ORDER — LEVETIRACETAM IN NACL 1000 MG/100ML IV SOLN
20.0000 mg/kg | Freq: Once | INTRAVENOUS | Status: AC
  Administered 2017-07-13: 1000 mg via INTRAVENOUS
  Filled 2017-07-13 (×2): qty 100

## 2017-07-13 MED ORDER — LEVETIRACETAM 500 MG PO TABS: 500.0000 mg | ORAL_TABLET | Freq: Two times a day (BID) | ORAL | 1 refills | Status: AC

## 2017-07-13 NOTE — Discharge Instructions (Signed)
Take Keppra 500 mg twice a day.  Follow-up with Dr. Malvin JohnsPotter or Sherryll BurgerShah for further evaluation of seizure episodes.  Return to the emergency room if you have any further seizures.  Seizures may happen at any time. It is important to take certain precautions to maintain your safety.   Follow up with your doctor in 1-3 days.  If you were started on a seizure medication, take it as prescribed.  During a seizure, a person may injure himself or herself. Seizure precautions are guidelines that a person can follow in order to minimize injury during a seizure. For any activity, it is important to ask, "What would happen if I had a seizure while doing this?" Follow the below precautions.  Bathroom Safety  A person with seizures may want to shower instead of bathe to avoid accidental drowning. If falls occur during the patient's typical seizure, a person should use a shower seat, preferably one with a safety strap.  Use nonskid strips in your shower or tub.  Never use electrical equipment near water. This prevents accidental electrocution.  Consider changing glass in shower doors to shatterproof glass.  Secondary school teacherKitchen Safety If possible, cook when someone else is nearby.  Use the back burners of the stove to prevent accidental burns.  Use shatterproof containers as much as possible. For instance, sauces can be transferred from glass bottles to plastic containers for use.  Limit time that is required using knives or other sharp objects. If possible, buy foods that are already cut, or ask someone to help in meal preparation.   General Safety at Home Do not smoke or light fires in the fireplace unless someone else is present.  Do not use space heaters that can be accidentally overturned.  When alone, avoid using step stools or ladders, and do not clean rooftop gutters.  Purchase power tools and motorized Risk managerlawn equipment which have a safety switch that will stop the machine if you release the handle (a 'dead man's'  switch).   Driving and Transportation DO NOT DRIVE UNTIL YOU ARE CLEARED BY A NEUROLOGIST and/or you have permission to drive from your state's Department of Motor Vehicles  [facility](DMV). Each state has different laws. Please refer to the following link on the Epilepsy Foundation of America's website for more information: http://www.epilepsyfoundation.org/answerplace/Social/driving/drivingu.cfm  If you ride a bicycle, wear a helmet and any other necessary protective gear.  When taking public transportation like the bus or subway, stay clear of the platform edge.   Outdoor Theatre managerand Sports Safety Swimming is okay, but does present certain risks. Never swim alone, and tell friends what to do if you have a seizure while swimming.  Wear appropriate protective equipment.  Ski with a friend. If a seizure occurs, your friend can seek help, if needed. He or she can also help to get you out of the cold. Consider using a safety hook or belt while riding the ski lift.

## 2017-07-13 NOTE — Consult Note (Signed)
Reason for Consult:Seizure Referring Physician: Don PerkingVeronese  CC: Seizure  HPI: Beth Ramirez is an 84 y.o. female with a history of dementia who had a generalized tonic-clonic seizure at the dining hall in her nursing home.  She was standing up and immediately collapsed to the ground.  She hit her head on a door frame.  Patient had generalized tonic-clonic activity for 15 seconds witnessed by nursing staff.  According to the family and the staff patient has been having these episodes since last year.  She has never been formally evaluated by a neurologist for seizure disorder.  She is not on any medications for it but it appears with each that she has she is less functional after for a longer period of time.  Stayed in bed for two days after last one.  At baseline ambulatory but incontinent and requires help with all ADL's.  Does not recognize family members.     Past Medical History:  Diagnosis Date  . Acute cholecystitis with chronic cholecystitis   . Dementia   . Hypertension   . TIA (transient ischemic attack)   . Urinary tract infection     Past Surgical History:  Procedure Laterality Date  . BLADDER SURGERY     bladder tact not sling    No family history on file.  Social History:  reports that she has never smoked. She does not have any smokeless tobacco history on file. She reports that she does not drink alcohol. Her drug history is not on file.  Allergies  Allergen Reactions  . Ace Inhibitors   . Ciprofloxacin Hives and Itching  . Doxycycline Hives and Itching  . Zofran [Ondansetron Hcl] Nausea And Vomiting  . Lisinopril Swelling and Rash    Medications: I have reviewed the patient's current medications. Prior to Admission:  Prior to Admission medications   Medication Sig Start Date End Date Taking? Authorizing Provider  acetaminophen (TYLENOL) 500 MG tablet Take 500 mg by mouth every 6 (six) hours as needed for mild pain or moderate pain.    Yes [provider]  aspirin EC 81 MG tablet Take 81 mg by mouth at bedtime.   Yes [provider]  bimatoprost (LUMIGAN) 0.01 % SOLN Place 1 drop into the left eye at bedtime.   Yes [provider]  docusate sodium (COLACE) 100 MG capsule Take 100 mg by mouth daily.   Yes [provider]  dorzolamide-timolol (COSOPT) 22.3-6.8 MG/ML ophthalmic solution Place 1 drop into both eyes 2 (two) times daily.   Yes [provider]  hydrALAZINE (APRESOLINE) 50 MG tablet Take 50 mg by mouth 2 (two) times daily.    Yes [provider]  ketotifen (ZADITOR) 0.025 % ophthalmic solution Place 1 drop into both eyes 2 (two) times daily. 07/11/17 07/05/2017 Yes [provider]  loperamide (IMODIUM) 2 MG capsule Take 2 mg by mouth every 8 (eight) hours as needed for diarrhea or loose stools.    Yes [provider]  metoprolol (LOPRESSOR) 100 MG tablet Take 100 mg by mouth 2 (two) times daily.   Yes [provider]  nitrofurantoin, macrocrystal-monohydrate, (MACROBID) 100 MG capsule Take 100 mg by mouth daily.   Yes [provider]  Skin Protectants, Misc. (ENDIT EX) Apply 1 application topically 2 (two) times daily.   Yes [provider]  trimethoprim-polymyxin b (POLYTRIM) ophthalmic solution Place 1 drop into both eyes 4 (four) times daily. 07/12/17 07/06/2017 Yes [provider]  levETIRAcetam (KEPPRA) 500  MG tablet Take 1 tablet (500 mg total) by mouth 2 (two) times daily. 07/13/17 07/13/18  Nita Sickle, MD    ROS: Unable to provide due to mental status  Physical Examination: Blood pressure (!) 125/58, pulse 85, temperature 98.2 F (36.8 C), temperature source Oral, resp. rate 18, height 5\' 2"  (1.575 m), weight 67.1 kg (148 lb), SpO2 95 %.  HEENT-  Normocephalic, no lesions, without obvious abnormality.  Normal external eye and conjunctiva.  Normal TM's bilaterally.  Normal auditory canals and external ears. Normal  external nose, mucus membranes and septum.  Normal pharynx. Cardiovascular- S1, S2 normal, pulses palpable throughout   Lungs- chest clear, no wheezing, rales, normal symmetric air entry Abdomen- soft, non-tender; bowel sounds normal; no masses,  no organomegaly Extremities- LE edema Lymph-no adenopathy palpable Musculoskeletal-no joint tenderness, deformity or swelling Skin-bruising throughout  Neurological Examination   Mental Status: Lethargic but can be awakened.  Does not follow commands.  No speech. Cranial Nerves: II: Blinks to bilateral confrontation, pupils equal, round, reactive to light and accommodation III,IV, VI: ptosis not present, extra-ocular motions grossly intact bilaterally V,VII: grimace symmetric, facial light touch sensation normal bilaterally VIII: HOH IX,X: unable to test XI:  unable to test XII:  unable to test Motor: Moves all extremities against gravity with no focal weakness noted.  Sensory: Responds to light noxious stimuli in all extremities Deep Tendon Reflexes: 2+ and symmetric with absent AJ's bilaterally Plantars: Right: mute   Left: mute Cerebellar: unable to test due to ability to follow commands Gait: not tested due to safety concerns    Laboratory Studies:   Basic Metabolic Panel: Recent Labs  Lab 07/13/17 1241  NA 138  K 3.7  CL 105  CO2 24  GLUCOSE 125*  BUN 19  CREATININE 0.70  CALCIUM 8.2*    Liver Function Tests: Recent Labs  Lab 07/13/17 1241  AST 31  ALT 17  ALKPHOS 79  BILITOT 0.9  PROT 6.3*  ALBUMIN 3.2*   No results for input(s): LIPASE, AMYLASE in the last 168 hours. No results for input(s): AMMONIA in the last 168 hours.  CBC: Recent Labs  Lab 07/13/17 1241  WBC 15.0*  NEUTROABS 13.4*  HGB 11.9*  HCT 34.4*  MCV 95.2  PLT 195    Cardiac Enzymes: Recent Labs  Lab 07/13/17 1241  TROPONINI <0.03    BNP: Invalid input(s): POCBNP  CBG: No results for input(s): GLUCAP in the last 168  hours.  Microbiology: No results found for this or any previous visit.  Coagulation Studies: No results for input(s): LABPROT, INR in the last 72 hours.  Urinalysis:  Recent Labs  Lab 07/13/17 1241  COLORURINE YELLOW*  LABSPEC 1.016  PHURINE 6.0  GLUCOSEU NEGATIVE  HGBUR NEGATIVE  BILIRUBINUR NEGATIVE  KETONESUR NEGATIVE  PROTEINUR 30*  NITRITE NEGATIVE  LEUKOCYTESUR NEGATIVE    Lipid Panel:  No results found for: CHOL, TRIG, HDL, CHOLHDL, VLDL, LDLCALC  HgbA1C: No results found for: HGBA1C  Urine Drug Screen:  No results found for: LABOPIA, COCAINSCRNUR, LABBENZ, AMPHETMU, THCU, LABBARB  Alcohol Level: No results for input(s): ETH in the last 168 hours.  Other results: EKG: sinus rhythm at 79 bpm  Imaging: Ct Head Wo Contrast  Result Date: 07/13/2017 CLINICAL DATA:  Minor head trauma. Possible seizure. Initial encounter. EXAM: CT HEAD WITHOUT CONTRAST CT CERVICAL SPINE WITHOUT CONTRAST TECHNIQUE: Multidetector CT imaging of the head and cervical spine was performed following the standard protocol without intravenous contrast. Multiplanar CT image reconstructions  of the cervical spine were also generated. COMPARISON:  03/29/2017 FINDINGS: CT HEAD FINDINGS Brain: No evidence of acute infarction, hemorrhage, hydrocephalus, extra-axial collection or mass lesion/mass effect. Advanced atrophy, especially at the medial temporal lobes, an Alzheimer's disease pattern in this patient with history of dementia. Vascular: Atherosclerotic calcification Skull: Negative Sinuses/Orbits: Bilateral cataract resection. CT CERVICAL SPINE FINDINGS Alignment: No traumatic malalignment Skull base and vertebrae: Remote appearing T1 and T3 superior endplate fractures. Negative for cervical spine fracture. Soft tissues and spinal canal: No prevertebral fluid or swelling. No visible canal hematoma. Disc levels: Degenerative changes without evidence of cord impingement. Upper chest: Negative IMPRESSION: 1.  No evidence of acute intracranial or cervical spine injury. 2. Advanced atrophy in keeping with history of dementia. Electronically Signed   By: Marnee Spring M.D.   On: 07/13/2017 12:35   Ct Cervical Spine Wo Contrast  Result Date: 07/13/2017 CLINICAL DATA:  Minor head trauma. Possible seizure. Initial encounter. EXAM: CT HEAD WITHOUT CONTRAST CT CERVICAL SPINE WITHOUT CONTRAST TECHNIQUE: Multidetector CT imaging of the head and cervical spine was performed following the standard protocol without intravenous contrast. Multiplanar CT image reconstructions of the cervical spine were also generated. COMPARISON:  03/29/2017 FINDINGS: CT HEAD FINDINGS Brain: No evidence of acute infarction, hemorrhage, hydrocephalus, extra-axial collection or mass lesion/mass effect. Advanced atrophy, especially at the medial temporal lobes, an Alzheimer's disease pattern in this patient with history of dementia. Vascular: Atherosclerotic calcification Skull: Negative Sinuses/Orbits: Bilateral cataract resection. CT CERVICAL SPINE FINDINGS Alignment: No traumatic malalignment Skull base and vertebrae: Remote appearing T1 and T3 superior endplate fractures. Negative for cervical spine fracture. Soft tissues and spinal canal: No prevertebral fluid or swelling. No visible canal hematoma. Disc levels: Degenerative changes without evidence of cord impingement. Upper chest: Negative IMPRESSION: 1. No evidence of acute intracranial or cervical spine injury. 2. Advanced atrophy in keeping with history of dementia. Electronically Signed   By: Marnee Spring M.D.   On: 07/13/2017 12:35     Assessment/Plan: 84 year old female with a history of dementia presenting with recurrent episodes described as seizure.  Patient on no anticonvulsant therapy.  Head CT reviewed and shows no acute changes.  After conversation with son, family is not interested in aggressive measures. Seizure activity likely related to dementia.     Recommendations: 1. Keppra 1000mg  IV now with maintenance to continue at 500mg  BID 2. Seizure precautions 3. Patient will not require further imaging in the acute setting.  Family reports that if other findings identified such as brain tumor, aneurysm, etc they would not be interested in further intervention.  Case discussed with Dr. Cheri Rous, MD Neurology [phone] 07/13/2017, 2:51 PM

## 2017-07-13 NOTE — ED Triage Notes (Addendum)
Pt to ED from [facility] after staff report a seizure with a fall. Posterior head trauma and right arm skin tear reported. EMS reported a hx of seizures and staff reported pt is usually post ictal for 45 minutes after seizures but no hx listed in pt chart and no medications listed on MAR for a seizure disorder. Post ictal upon arrival and not following commands or answering questions. Pt is responsive to voice but has eyes shut. NO trauma noted to tongue and no loss of bowel or bladder.

## 2017-07-13 NOTE — ED Provider Notes (Signed)
[facility] Emergency Department Provider Note  ____________________________________________  Time seen: Approximately 1:04 PM  I have reviewed the triage vital signs and the nursing notes.   HISTORY  Chief Complaint Seizures  Level 5 caveat:  Portions of the history and physical were unable to be obtained due to post-ictal state   HPI Beth Ramirez is a 84 y.o. female with a history of dementia, TIA, hypertension who presents for evaluation of a seizure.  Patient had a generalized tonic-clonic seizure at the dining hall in her nursing home.  She was standing up and immediately collapsed to the ground.  She hit her head on a door frame.  Patient had generalized tonic-clonic activity for 15 seconds witnessed by nursing staff.  According to the family and the staff patient has been having these episodes since last year.  She has never been formally evaluated by a neurologist for seizure disorder.  She is not on any medications for it.  She usually remains postictal for 45 minutes to 1 hour after these episodes.  Patient is unable to provide any history at this time as she is currently postictal.  Patient is not on any blood thinners.  According to staff she has not had any recent illness.  Past Medical History:  Diagnosis Date  . Acute cholecystitis with chronic cholecystitis   . Dementia   . Hypertension   . TIA (transient ischemic attack)   . Urinary tract infection     Past Surgical History:  Procedure Laterality Date  . BLADDER SURGERY     bladder tact not sling    Prior to Admission medications   Medication Sig Start Date End Date Taking? Authorizing Provider  acetaminophen (TYLENOL) 500 MG tablet Take 500 mg by mouth every 6 (six) hours as needed for mild pain or moderate pain.    Yes [provider]  aspirin EC 81 MG tablet Take 81 mg by mouth at bedtime.   Yes [provider]  bimatoprost (LUMIGAN) 0.01 % SOLN Place 1 drop  into the left eye at bedtime.   Yes [provider]  docusate sodium (COLACE) 100 MG capsule Take 100 mg by mouth daily.   Yes [provider]  dorzolamide-timolol (COSOPT) 22.3-6.8 MG/ML ophthalmic solution Place 1 drop into both eyes 2 (two) times daily.   Yes [provider]  hydrALAZINE (APRESOLINE) 50 MG tablet Take 50 mg by mouth 2 (two) times daily.    Yes [provider]  ketotifen (ZADITOR) 0.025 % ophthalmic solution Place 1 drop into both eyes 2 (two) times daily. 07/11/17 06/24/2017 Yes [provider]  loperamide (IMODIUM) 2 MG capsule Take 2 mg by mouth every 8 (eight) hours as needed for diarrhea or loose stools.    Yes [provider]  metoprolol (LOPRESSOR) 100 MG tablet Take 100 mg by mouth 2 (two) times daily.   Yes [provider]  nitrofurantoin, macrocrystal-monohydrate, (MACROBID) 100 MG capsule Take 100 mg by mouth daily.   Yes [provider]  Skin Protectants, Misc. (ENDIT EX) Apply 1 application topically 2 (two) times daily.   Yes [provider]  trimethoprim-polymyxin b (POLYTRIM) ophthalmic solution Place 1 drop into both eyes 4 (four) times daily. 07/12/17 07/13/2017 Yes [provider]  levETIRAcetam (KEPPRA) 500 MG tablet Take 1 tablet (500 mg total) by mouth 2 (two) times daily. 07/13/17 07/13/18  Nita Sickle, MD    Allergies Ace inhibitors; Ciprofloxacin; Doxycycline; Zofran [ondansetron hcl]; and Lisinopril  No family history on file.  Social History Social History   Tobacco Use  . Smoking status: Never Smoker  Substance Use Topics  . Alcohol use: No  . Drug use: Not on file    Review of Systems Constitutional: Negative for fever. ENT: Negative for facial injury or neck injury Respiratory: Negative for chest wall injury. Musculoskeletal: Negative for back injury, negative for arm or leg pain. Skin: + R elbow skin tear Neurological: + for head injury,  seizure   ____________________________________________   PHYSICAL EXAM:  VITAL SIGNS: ED Triage Vitals [07/13/17 1142]  Enc Vitals Group     BP (!) 127/102     Pulse Rate 85     Resp 16     Temp 98.2 F (36.8 C)     Temp Source Oral     SpO2 95 %     Weight 148 lb (67.1 kg)     Height 5\' 2"  (1.575 m)     Head Circumference      Peak Flow      Pain Score      Pain Loc      Pain Edu?      Excl. in GC?     Constitutional: Patient is postictal, keeping her eyes closed, will not open them but will grunt and fight [place] when we try to move her extremities  HEENT Head: Normocephalic, occipital scalp hematoma Face: No facial injury. Stable midface Ears: No hemotympanum bilaterally. No Battle sign Eyes: No eye injury. No raccoon eyes Nose: Nontender. No epistaxis. No rhinorrhea Mouth/Throat: Mucous membranes are moist. No oropharyngeal blood. No dental injury. Airway patent without stridor. Normal voice. Neck: no C-collar in place. No midline c-spine tenderness.  Cardiovascular: Normal rate, regular rhythm. Normal and symmetric distal pulses are present in all extremities. Pulmonary/Chest: Chest wall is stable and nontender to palpation/compression. Normal respiratory effort. Breath sounds are normal. No crepitus.  Abdominal: Soft, non distended. Musculoskeletal: Nontender with normal full range of motion in all extremities. No deformities. No thoracic or lumbar midline spinal tenderness. Pelvis is stable. Skin: Skin is warm, dry and intact. R elbow skin tear Neurological: Does not open eyes, will grunt and fight [place] when we try to open her eyes or move her extremities   ____________________________________________   LABS (all labs ordered are listed, but only abnormal results are displayed)  Labs Reviewed  CBC WITH DIFFERENTIAL/PLATELET - Abnormal; Notable for the following components:      Result Value   WBC 15.0 (*)    RBC 3.62 (*)    Hemoglobin 11.9 (*)    HCT 34.4 (*)     Neutro Abs 13.4 (*)    Lymphs Abs 0.6 (*)    All other components within normal limits  COMPREHENSIVE METABOLIC PANEL - Abnormal; Notable for the following components:   Glucose, Bld 125 (*)    Calcium 8.2 (*)    Total Protein 6.3 (*)    Albumin 3.2 (*)    All other components within normal limits  URINALYSIS, COMPLETE (UACMP) WITH MICROSCOPIC - Abnormal; Notable for the following components:   Color, Urine YELLOW (*)    APPearance CLEAR (*)    Protein, ur 30 (*)    All other components within normal limits  TROPONIN I   ____________________________________________  EKG  ED ECG REPORT I, Nita Sickle, the attending physician, personally viewed and interpreted this ECG.  Normal sinus rhythm with first-degree AV block, rate of 76, normal intervals, normal axis, Q waves  in anterior leads, no ST elevations or depressions.  Unchanged from prior from 3/19 ____________________________________________  RADIOLOGY  I have personally reviewed the images performed during this visit and I agree with the Radiologist's read.   Interpretation by Radiologist:  Ct Head Wo Contrast  Result Date: 07/13/2017 CLINICAL DATA:  Minor head trauma. Possible seizure. Initial encounter. EXAM: CT HEAD WITHOUT CONTRAST CT CERVICAL SPINE WITHOUT CONTRAST TECHNIQUE: Multidetector CT imaging of the head and cervical spine was performed following the standard protocol without intravenous contrast. Multiplanar CT image reconstructions of the cervical spine were also generated. COMPARISON:  03/29/2017 FINDINGS: CT HEAD FINDINGS Brain: No evidence of acute infarction, hemorrhage, hydrocephalus, extra-axial collection or mass lesion/mass effect. Advanced atrophy, especially at the medial temporal lobes, an Alzheimer's disease pattern in this patient with history of dementia. Vascular: Atherosclerotic calcification Skull: Negative Sinuses/Orbits: Bilateral cataract resection. CT CERVICAL SPINE FINDINGS Alignment:  No traumatic malalignment Skull base and vertebrae: Remote appearing T1 and T3 superior endplate fractures. Negative for cervical spine fracture. Soft tissues and spinal canal: No prevertebral fluid or swelling. No visible canal hematoma. Disc levels: Degenerative changes without evidence of cord impingement. Upper chest: Negative IMPRESSION: 1. No evidence of acute intracranial or cervical spine injury. 2. Advanced atrophy in keeping with history of dementia. Electronically Signed   By: Marnee SpringJonathon  Watts M.D.   On: 07/13/2017 12:35   Ct Cervical Spine Wo Contrast  Result Date: 07/13/2017 CLINICAL DATA:  Minor head trauma. Possible seizure. Initial encounter. EXAM: CT HEAD WITHOUT CONTRAST CT CERVICAL SPINE WITHOUT CONTRAST TECHNIQUE: Multidetector CT imaging of the head and cervical spine was performed following the standard protocol without intravenous contrast. Multiplanar CT image reconstructions of the cervical spine were also generated. COMPARISON:  03/29/2017 FINDINGS: CT HEAD FINDINGS Brain: No evidence of acute infarction, hemorrhage, hydrocephalus, extra-axial collection or mass lesion/mass effect. Advanced atrophy, especially at the medial temporal lobes, an Alzheimer's disease pattern in this patient with history of dementia. Vascular: Atherosclerotic calcification Skull: Negative Sinuses/Orbits: Bilateral cataract resection. CT CERVICAL SPINE FINDINGS Alignment: No traumatic malalignment Skull base and vertebrae: Remote appearing T1 and T3 superior endplate fractures. Negative for cervical spine fracture. Soft tissues and spinal canal: No prevertebral fluid or swelling. No visible canal hematoma. Disc levels: Degenerative changes without evidence of cord impingement. Upper chest: Negative IMPRESSION: 1. No evidence of acute intracranial or cervical spine injury. 2. Advanced atrophy in keeping with history of dementia. Electronically Signed   By: Marnee SpringJonathon  Watts M.D.   On: 07/13/2017 12:35      ____________________________________________   PROCEDURES  Procedure(s) performed: None Procedures Critical Care performed:  None ____________________________________________   INITIAL IMPRESSION / ASSESSMENT AND PLAN / ED COURSE   84 y.o. female with a history of dementia, TIA, hypertension who presents for evaluation of a seizure.  Patient has been having seizures since last year and has not been evaluated by a neurologist or started on any antiepileptic medication.  Unclear why that is.  I have spoken with patient's son who is at the bedside and he would like for patient to be seen by a neurologist and possibly started on an antiseizure medication.  Patient continues to be postictal.  Fortunately CT head and cervical spine did not show any injuries from her fall.  Labs are consistent with a leukocytosis and a white count of 15. No electrolyte abnormalities. UA is pending to rule out UTI as possible cause of leukocytosis. Patient has no meningeal signs, no fever for concerns of meningitis. Leukocytosis could  also be stress related due to recent seizure. Will consult Dr. Thad Ranger, neurology for evaluation.    _________________________ 2:51 PM on 07/13/2017 -----------------------------------------  Patient evaluated by Dr. Thad Ranger.  Family including son and daughter who is the POA did not wish patient to be admitted.  They agree with Keppra for which patient was loaded here and she is going to be discharged home on 500 twice daily.  Patient will be referred to neurologist for outpatient evaluation including EEG and MRI.  Discussed return precautions for any further episodes of seizure.  As part of my medical decision making, I reviewed the following data within the electronic MEDICAL RECORD NUMBER History obtained from family, Nursing notes reviewed and incorporated, Labs reviewed , EKG interpreted , Old chart reviewed, Radiograph reviewed , A consult was requested and obtained from this/these  consultant(s) Neurology, Notes from prior ED visits and [place] Controlled Substance Database    Pertinent labs & imaging results that were available during my care of the patient were reviewed by me and considered in my medical decision making (see chart for details).    ____________________________________________   FINAL CLINICAL IMPRESSION(S) / ED DIAGNOSES  Final diagnoses:  Seizure (HCC)      NEW MEDICATIONS STARTED DURING THIS VISIT:  ED Discharge Orders        Ordered    levETIRAcetam (KEPPRA) 500 MG tablet  2 times daily     07/13/17 1453       Note:  This document was prepared using Dragon voice recognition software and may include unintentional dictation errors.    Don Perking, [place], MD 07/13/17 [phone]

## 2017-07-13 NOTE — ED Notes (Signed)
CBG 178 with EMS

## 2017-07-13 NOTE — ED Notes (Signed)
Patient transported to CT 

## 2017-07-13 NOTE — ED Notes (Signed)
Seizure pads placed on bed. Pt continues to be post ictal at this time. Pt grunting when RN attempts to move arms to assess skin tear. PT has eyes closed and arms crossed over chest. NAD noted. Bed is locked and in lowest setting at this time with bed near nursing station to monitor pt.

## 2017-07-24 DEATH — deceased

## 2019-07-17 IMAGING — CT CT CERVICAL SPINE W/O CM
3 of 6 series · 13 of 33 positions shown, 15 images · non-contrast
Comparison: 03/29/2017

CLINICAL DATA: Minor head trauma.. Possible seizure. Initial
encounter.

EXAM:
CT HEAD WITHOUT CONTRAST
CT CERVICAL SPINE WITHOUT CONTRAST
TECHNIQUE: Multidetector CT imaging of the head and cervical spine was
performed following the standard protocol without intravenous
contrast. Multiplanar CT image reconstructions of the cervical spine
were also generated.

[Series 7: coronal soft tissue · coronal · 0.31mm/px · 3 of 61 slices shown]
[im 13/61  bone]
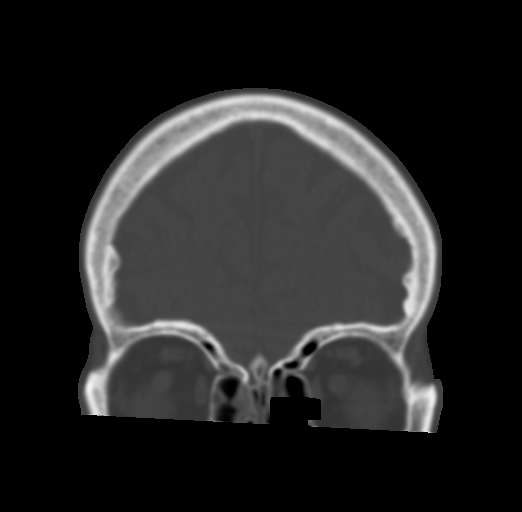
[im 25/61  bone]
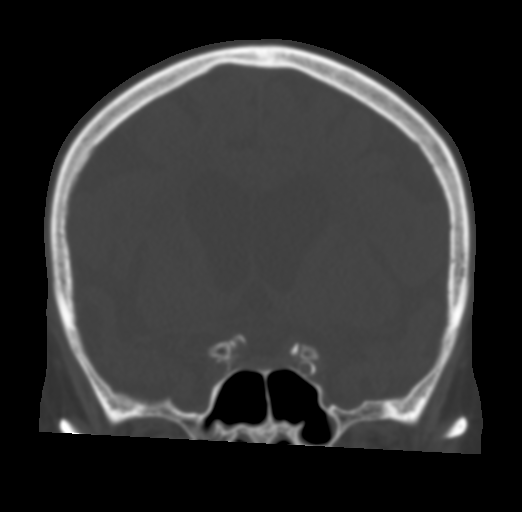
[im 37/61  bone]
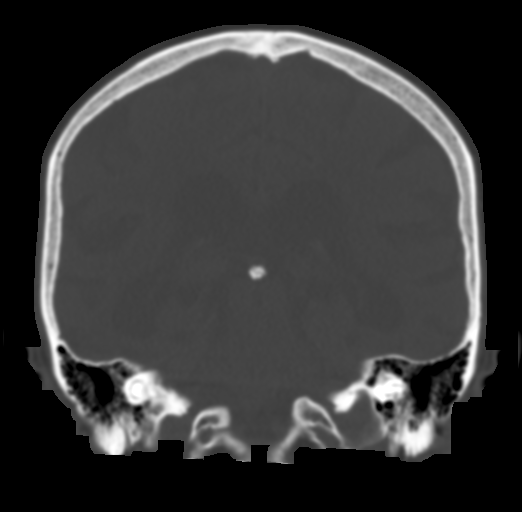

[Series 9: c spine soft · axial · 0.40mm/px · z∈[+666,+770]mm · 6 of 74 slices shown, 8 images]
[im 11/74  soft-tissue]
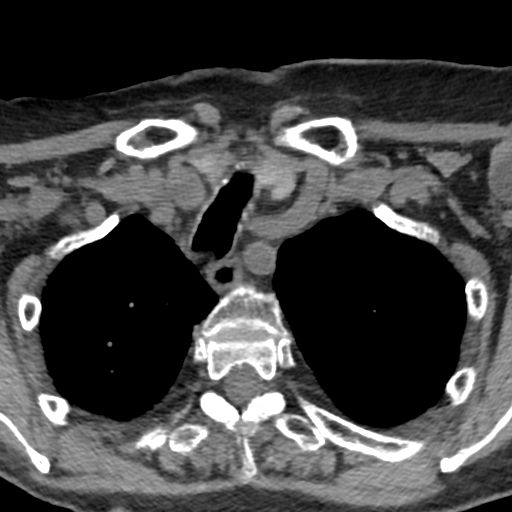
[im 11/74  bone]
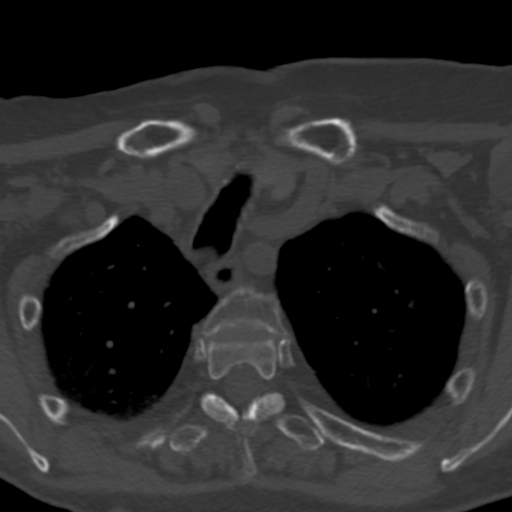
[im 21/74  bone]
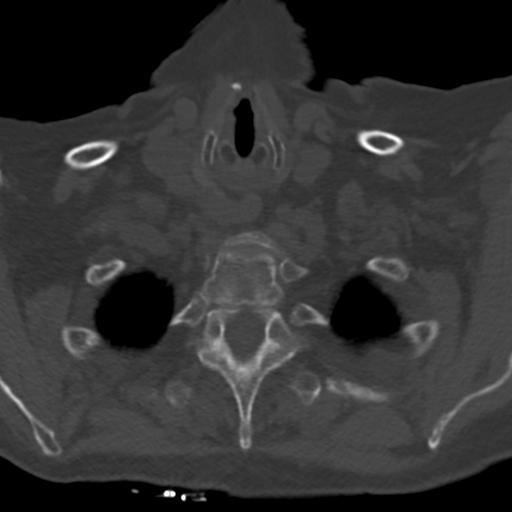
[im 32/74  bone]
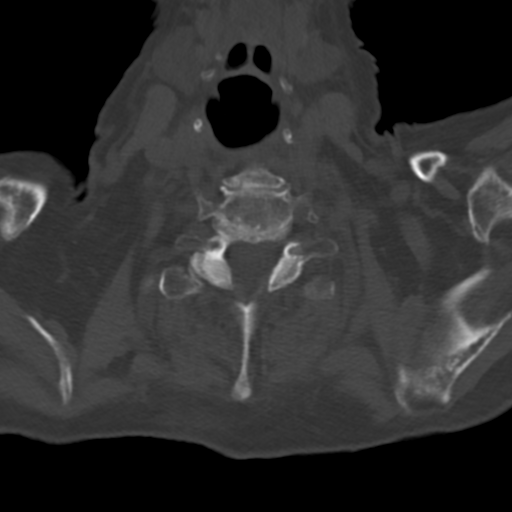
[im 42/74  bone]
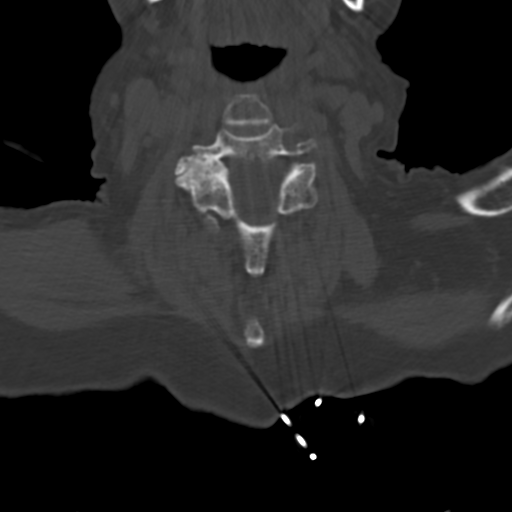
[im 53/74  soft-tissue]
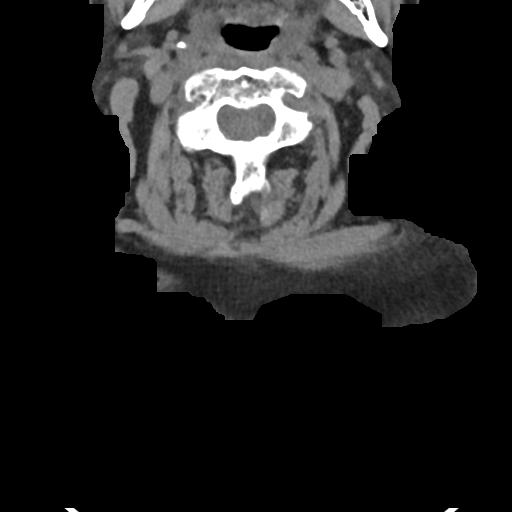
[im 53/74  bone]
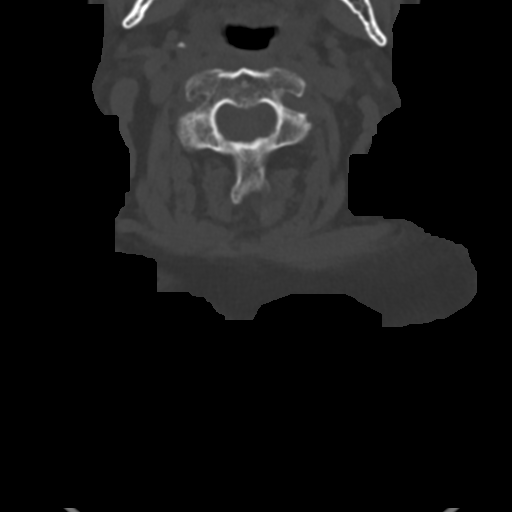
[im 63/74  bone]
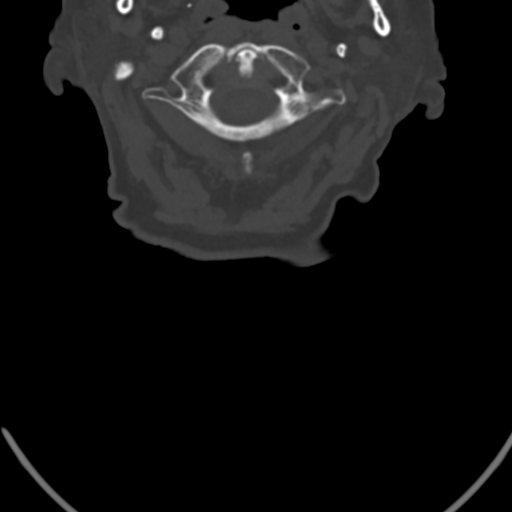

[Series 15: sagittal bone · sagittal · 0.24mm/px · 4 of 44 slices shown]
[im 9/44  bone]
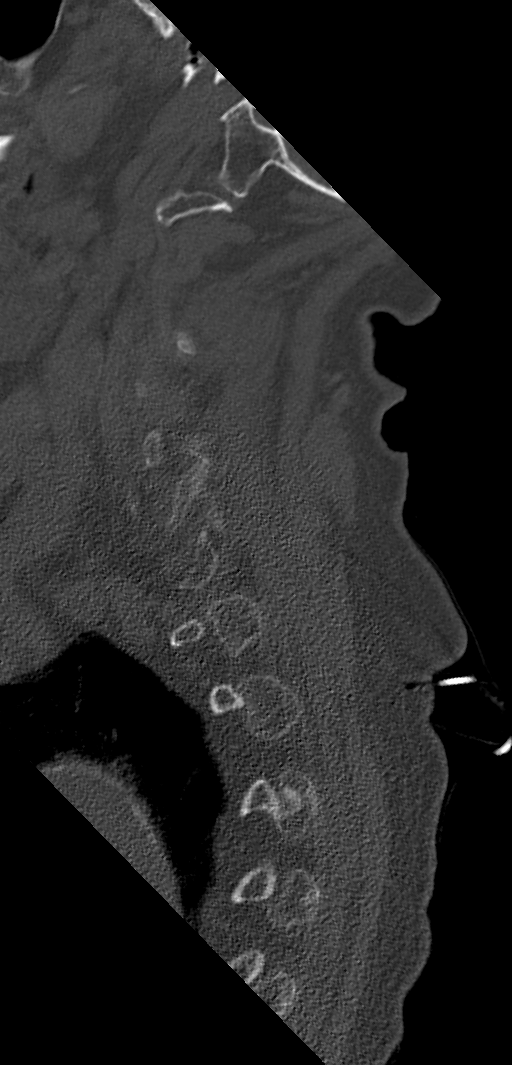
[im 18/44  bone]
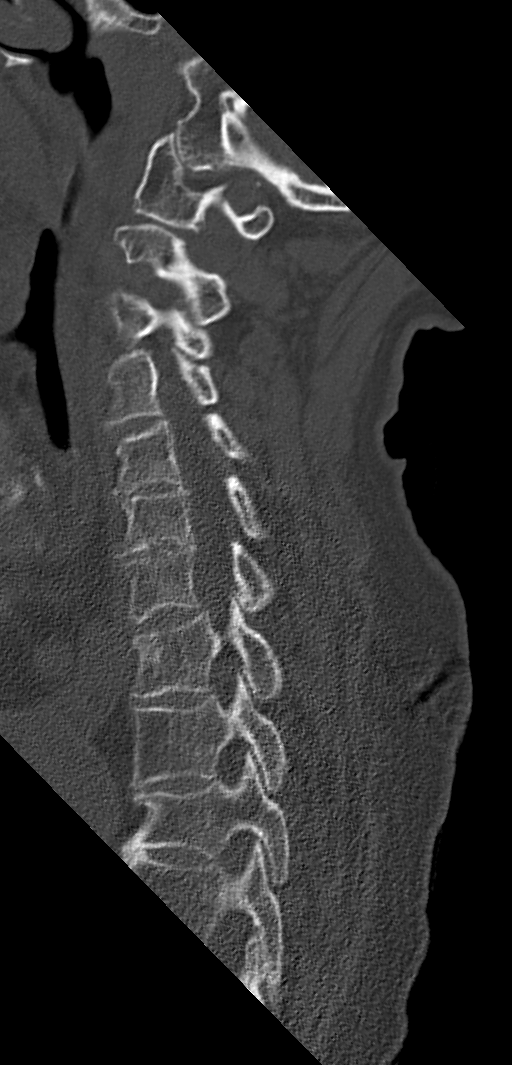
[im 26/44  bone]
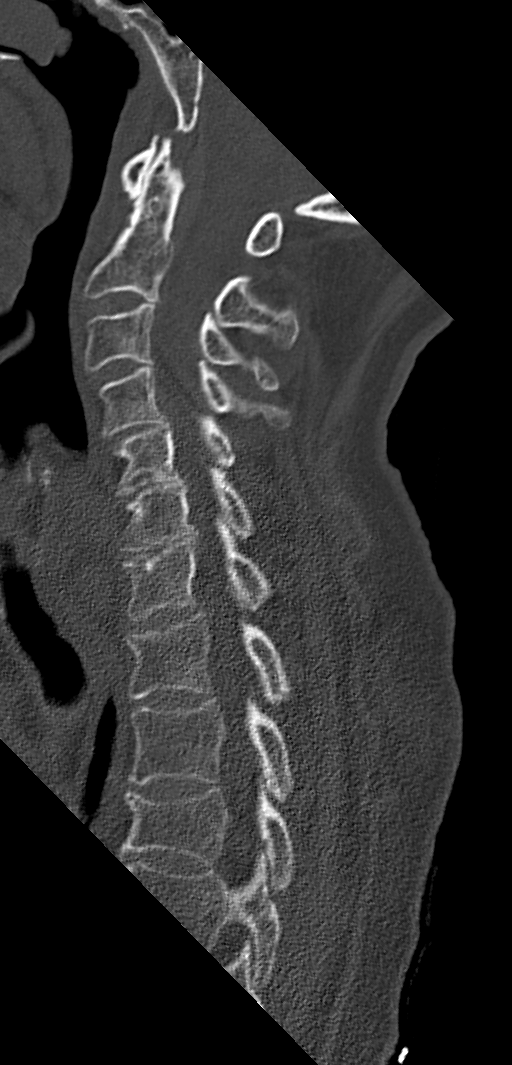
[im 35/44  bone]
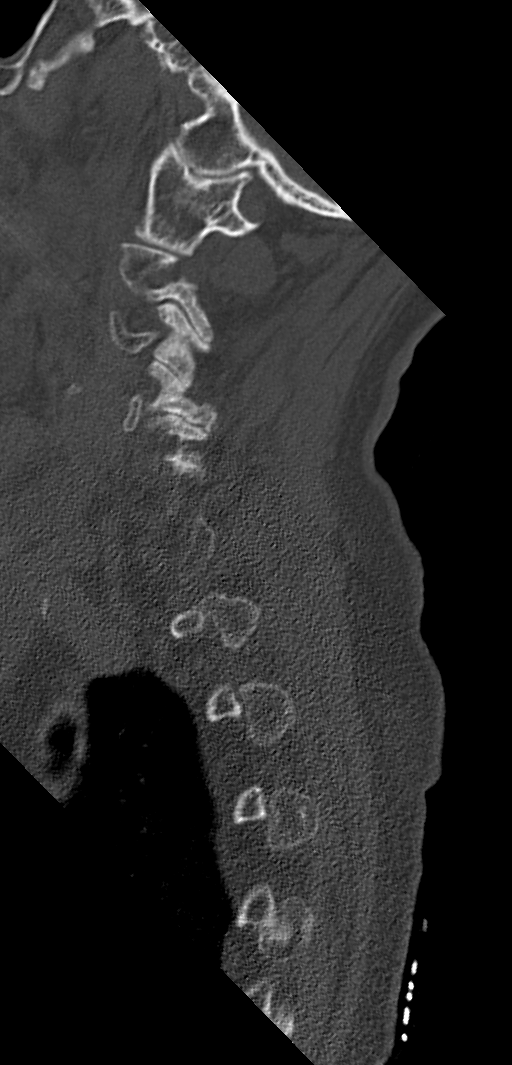

[13 of 33 positions shown; findings below may reference images not displayed]

FINDINGS: CT HEAD FINDINGS

Brain: No evidence of acute infarction, hemorrhage, hydrocephalus,
extra-axial collection or mass lesion/mass effect. Advanced atrophy,
especially at the medial temporal lobes, an Alzheimer's disease
pattern in this patient with history of dementia.

Vascular: Atherosclerotic calcification

Skull: Negative

Sinuses/Orbits: Bilateral cataract resection.

CT CERVICAL SPINE FINDINGS

Alignment: No traumatic malalignment

Skull base and vertebrae: Remote appearing T1 and T3 superior
endplate fractures. Negative for cervical spine fracture.

Soft tissues and spinal canal: No prevertebral fluid or swelling. No
visible canal hematoma.

Disc levels: Degenerative changes without evidence of cord
impingement.

Upper chest: Negative
IMPRESSION: 1. No evidence of acute intracranial or cervical spine injury.
2. Advanced atrophy in keeping with history of dementia.
